# Patient Record
Sex: Female | Born: 1970 | Race: Black or African American | Hispanic: No | Smoking: Never smoker
Health system: Southern US, Community
[De-identification: ages and names within clinical notes are randomized; demographics above are authoritative.]

## PROBLEM LIST (undated history)

## (undated) DIAGNOSIS — Z8669 Personal history of other diseases of the nervous system and sense organs: Secondary | ICD-10-CM

## (undated) DIAGNOSIS — M898X1 Other specified disorders of bone, shoulder: Secondary | ICD-10-CM

## (undated) DIAGNOSIS — G8929 Other chronic pain: Secondary | ICD-10-CM

## (undated) DIAGNOSIS — J302 Other seasonal allergic rhinitis: Secondary | ICD-10-CM

## (undated) HISTORY — DX: Other chronic pain: G89.29

## (undated) HISTORY — DX: Personal history of other diseases of the nervous system and sense organs: Z86.69

## (undated) HISTORY — DX: Other seasonal allergic rhinitis: J30.2

## (undated) HISTORY — DX: Other specified disorders of bone, shoulder: M89.8X1

---

## 2010-04-06 HISTORY — PX: PARTIAL HYSTERECTOMY: SHX80

## 2012-03-04 ENCOUNTER — Ambulatory Visit: Payer: Self-pay | Admitting: Family Medicine

## 2013-01-14 ENCOUNTER — Emergency Department: Payer: Self-pay | Admitting: Emergency Medicine

## 2013-01-14 LAB — CBC
HGB: 12.4 g/dL (ref 12.0–16.0)
MCHC: 34.2 g/dL (ref 32.0–36.0)
MCV: 83 fL (ref 80–100)
Platelet: 226 10*3/uL (ref 150–440)
RBC: 4.37 10*6/uL (ref 3.80–5.20)
RDW: 14.6 % — ABNORMAL HIGH (ref 11.5–14.5)
WBC: 7.5 10*3/uL (ref 3.6–11.0)

## 2013-01-14 LAB — BASIC METABOLIC PANEL
Anion Gap: 8 (ref 7–16)
BUN: 18 mg/dL (ref 7–18)
Calcium, Total: 8.7 mg/dL (ref 8.5–10.1)
Chloride: 104 mmol/L (ref 98–107)
Co2: 27 mmol/L (ref 21–32)
Glucose: 112 mg/dL — ABNORMAL HIGH (ref 65–99)
Potassium: 2.7 mmol/L — ABNORMAL LOW (ref 3.5–5.1)
Sodium: 139 mmol/L (ref 136–145)

## 2013-01-14 LAB — MAGNESIUM: Magnesium: 1.6 mg/dL — ABNORMAL LOW

## 2013-01-14 LAB — TROPONIN I: Troponin-I: <0.02 ng/mL

## 2013-07-24 IMAGING — US ABDOMEN ULTRASOUND
1 series · 14 of 25 positions shown · non-contrast
Comparison: none

REASON FOR EXAM: complete  Left sided abd pain w fever
COMMENTS:

[Series 1: abdomen ultrasound · 0.26mm/px · 14 of 118 slices shown]
[im 1/118]
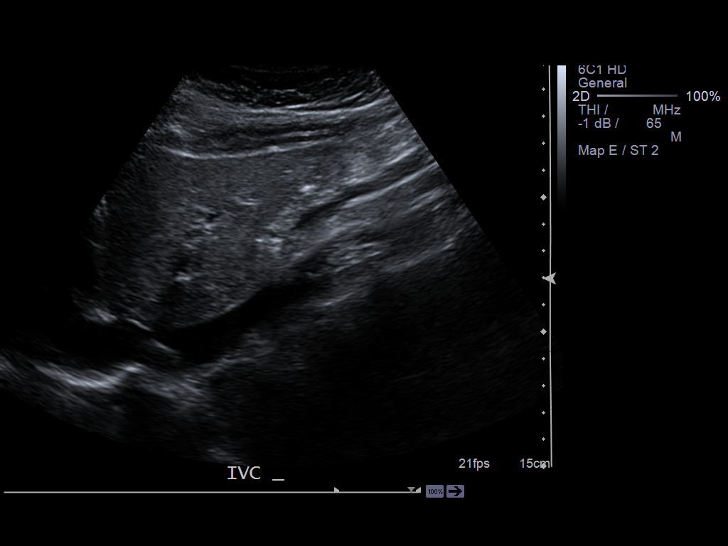
[im 10/118]
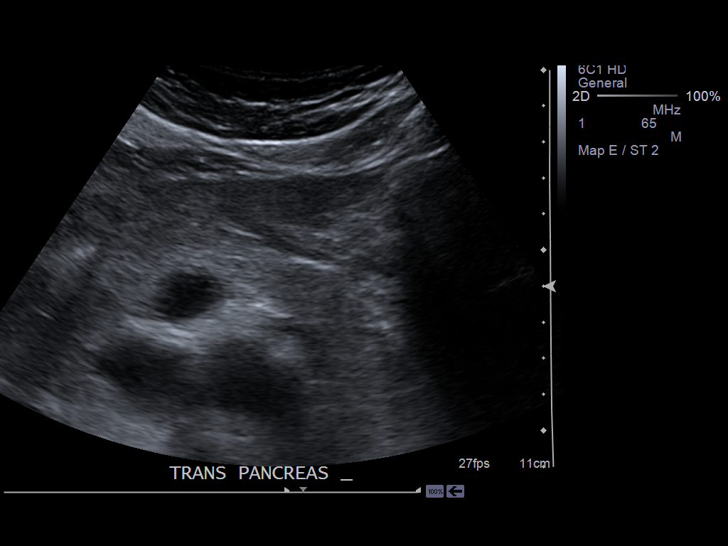
[im 20/118]
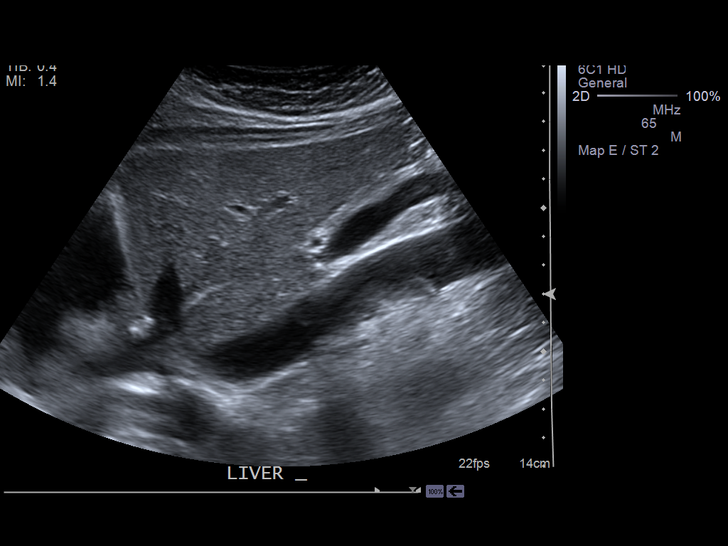
[im 30/118]
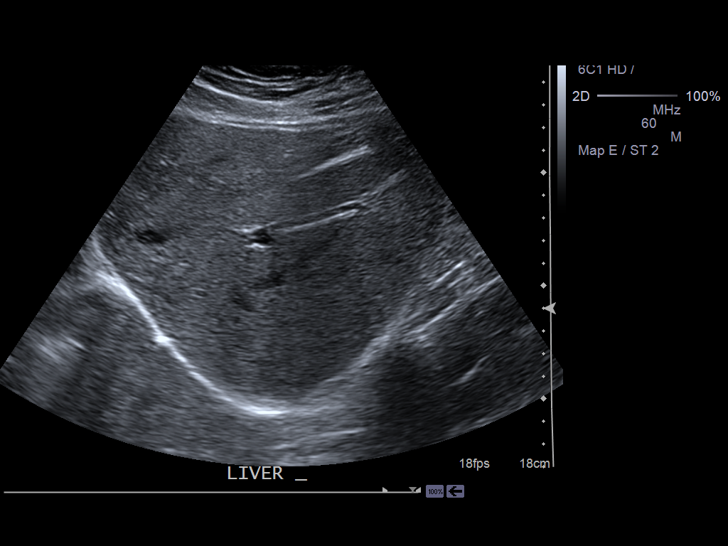
[im 40/118]
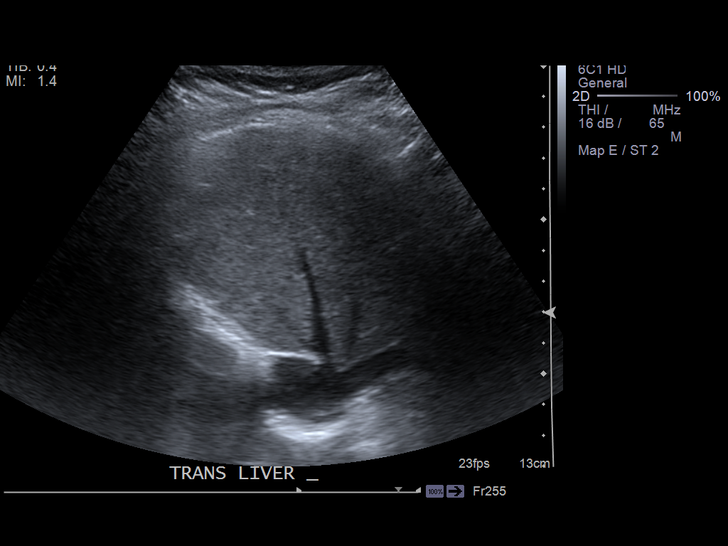
[im 44/118]
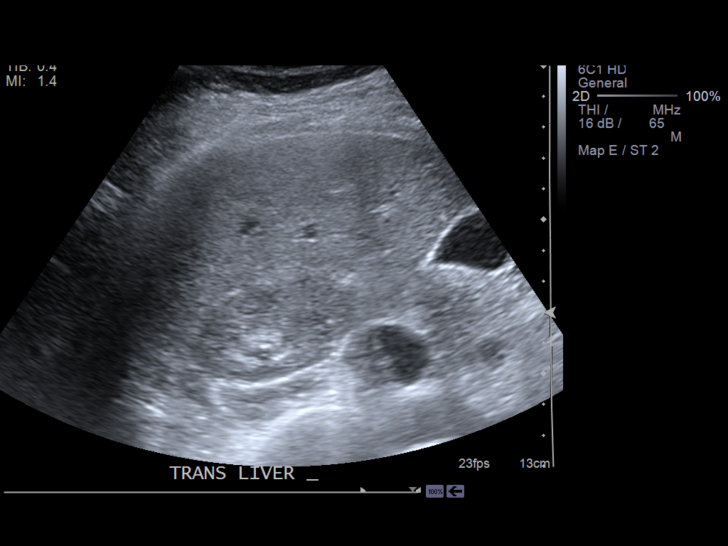
[im 54/118]
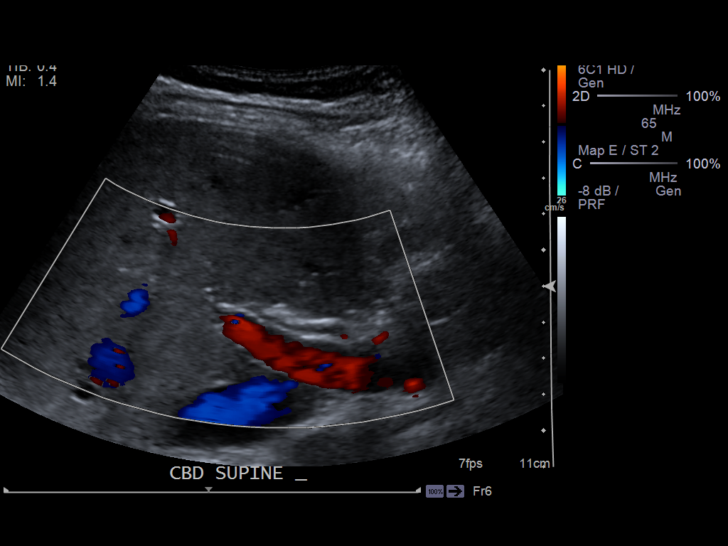
[im 64/118]
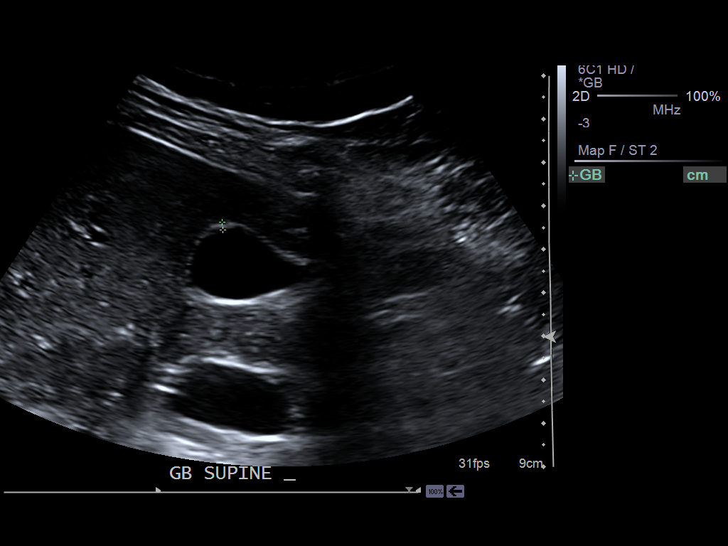
[im 74/118]
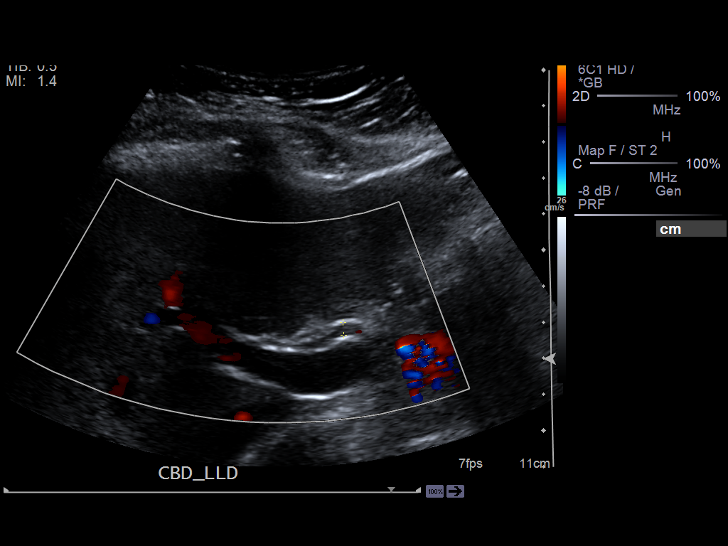
[im 79/118]
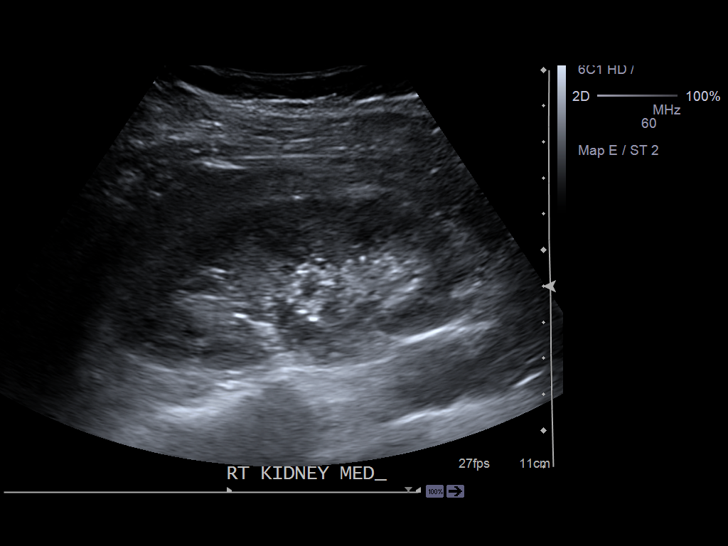
[im 88/118]
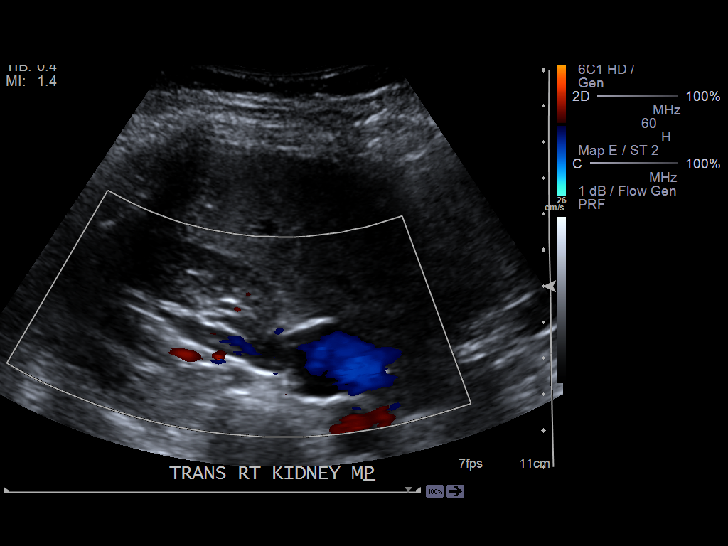
[im 98/118]
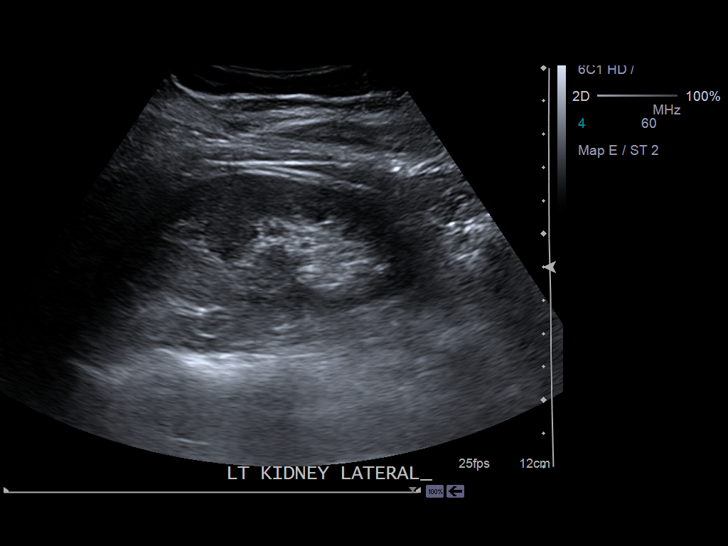
[im 108/118]
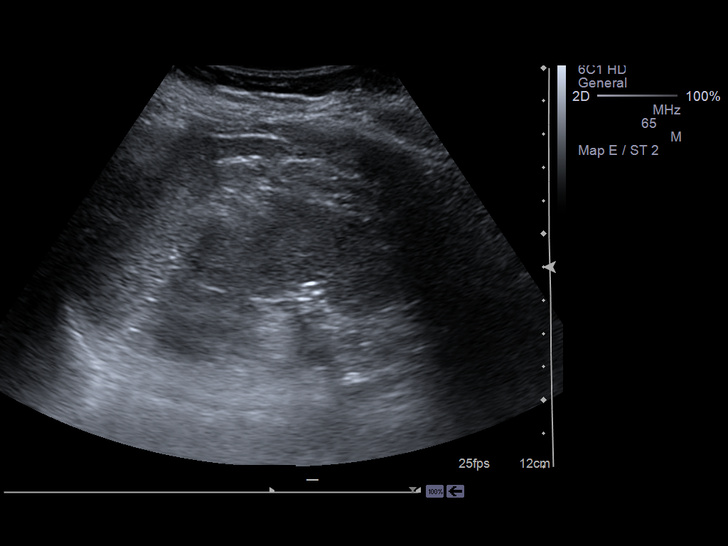
[im 118/118]
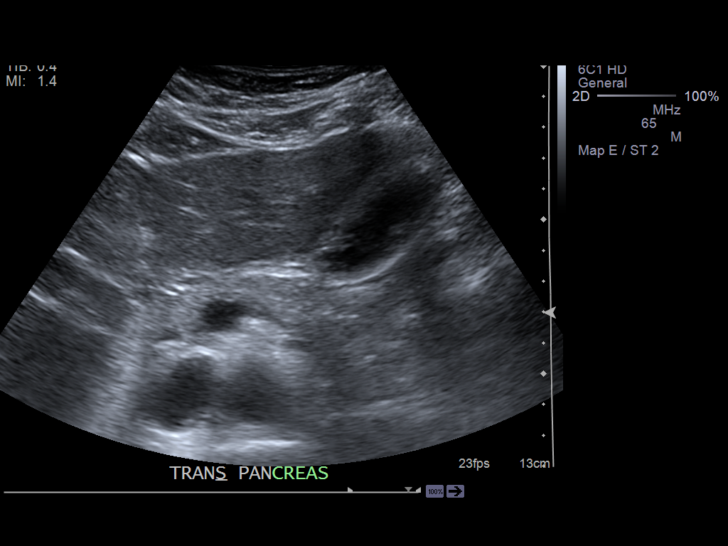

[14 of 25 positions shown; findings below may reference images not displayed]

PROCEDURE:     US  - US ABDOMEN GENERAL SURVEY  - March 04, 2012 [DATE]

RESULT:

Abdominal Ultrasound is performed in the standard fashion. The proximal
inferior vena cava is patent. The visualized aorta appears to taper distally
and shows atherosclerotic calcification. There is no cholelithiasis. The
visualized pancreas appears normal. The liver measures 14.75 cm and is
normal in echotexture. There is no ascites. The common bile duct diameter is
up to 3.6 mm. The gallbladder wall thickness is 1.8 mm. There is a negative
sonographic Murphy's sign without evidence of cholelithiasis or
pericholecystic fluid. The right kidney measures 10.09 x 4.34 x 3.66 cm. The
spleen measures up to 8.17 cm. The left kidney is 9.89 x 4.77 x 5.44 cm.
Both kidneys show no solid or cystic mass. No stones are evident.
IMPRESSION: No evidence of cholelithiasis. Normal appearing Abdominal
Sonogram.

[REDACTED]

## 2014-06-05 IMAGING — CR DG CHEST 2V
1 series · 2 of 2 positions shown · non-contrast
Comparison: none

REASON FOR EXAM: Chest Pain
COMMENTS:

PROCEDURE:     DXR - DXR CHEST PA (OR AP) AND LATERAL  - January 14, 2013  [DATE]
RESULT:     Comparison: None

[Series 1: w chest pa · 0.14mm/px · 2 of 2 slices shown]
[im 1/2]
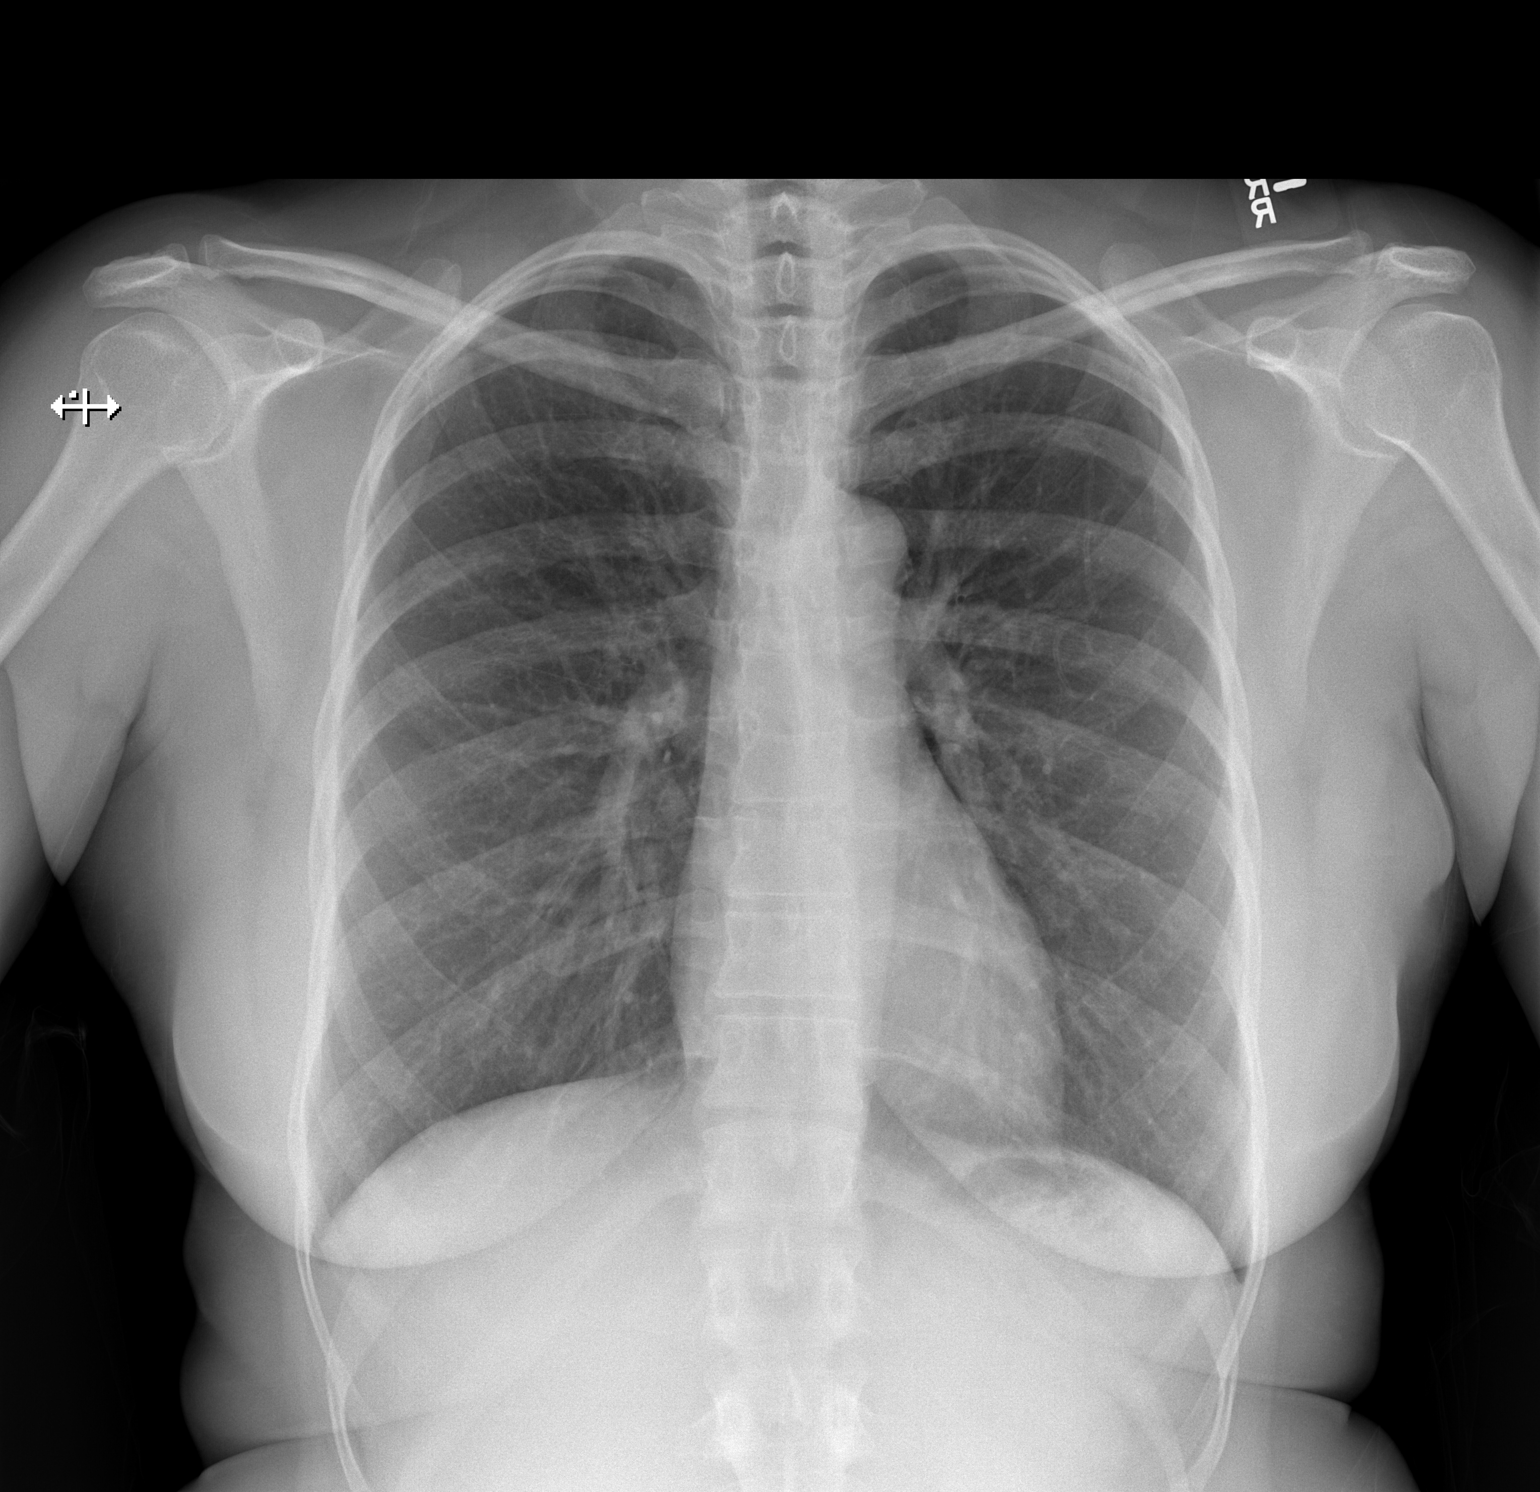
[im 2/2]
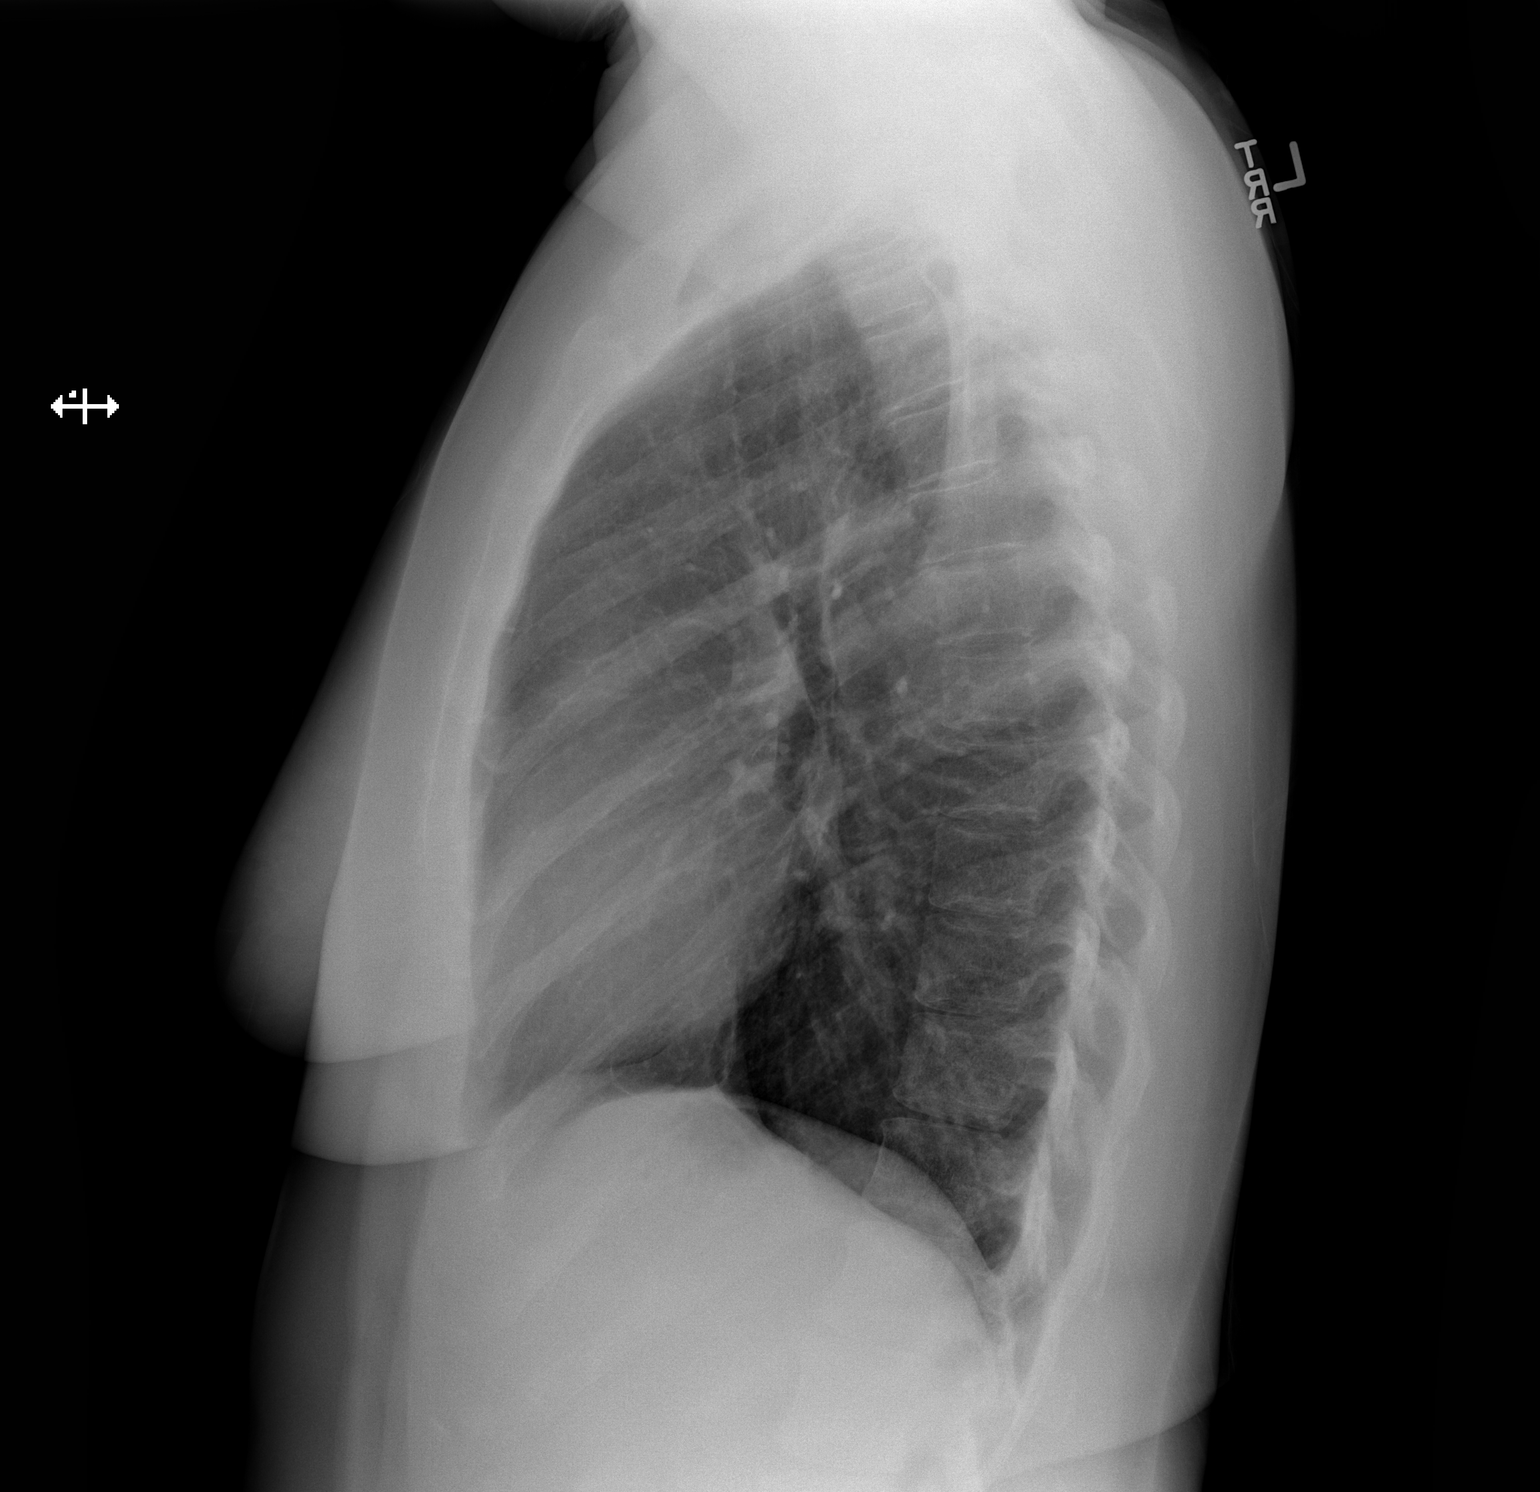

[2 of 2 positions shown; findings below may reference images not displayed]

FINDINGS: PA and lateral chest radiographs are provided.  There is no focal
parenchymal opacity, pleural effusion, or pneumothorax. The heart and
mediastinum are unremarkable.  The osseous structures are unremarkable.
IMPRESSION: No acute disease of the che[REDACTED]

## 2014-12-24 ENCOUNTER — Ambulatory Visit (INDEPENDENT_AMBULATORY_CARE_PROVIDER_SITE_OTHER): Payer: BC Managed Care – PPO | Admitting: Family Medicine

## 2014-12-24 ENCOUNTER — Encounter (INDEPENDENT_AMBULATORY_CARE_PROVIDER_SITE_OTHER): Payer: Self-pay

## 2014-12-24 ENCOUNTER — Encounter: Payer: Self-pay | Admitting: Family Medicine

## 2014-12-24 VITALS — BP 136/80 | HR 60 | Temp 98.3°F | Ht 66.25 in | Wt 142.2 lb

## 2014-12-24 DIAGNOSIS — R002 Palpitations: Secondary | ICD-10-CM

## 2014-12-24 DIAGNOSIS — R202 Paresthesia of skin: Secondary | ICD-10-CM

## 2014-12-24 DIAGNOSIS — M898X1 Other specified disorders of bone, shoulder: Secondary | ICD-10-CM | POA: Diagnosis not present

## 2014-12-24 DIAGNOSIS — G8929 Other chronic pain: Secondary | ICD-10-CM | POA: Diagnosis not present

## 2014-12-24 DIAGNOSIS — R9431 Abnormal electrocardiogram [ECG] [EKG]: Secondary | ICD-10-CM

## 2014-12-24 HISTORY — DX: Other chronic pain: G89.29

## 2014-12-24 LAB — COMPREHENSIVE METABOLIC PANEL
ALK PHOS: 48 U/L (ref 39–117)
ALT: 11 U/L (ref 0–35)
AST: 16 U/L (ref 0–37)
Albumin: 4.4 g/dL (ref 3.5–5.2)
BUN: 17 mg/dL (ref 6–23)
CO2: 28 mEq/L (ref 19–32)
Calcium: 9.1 mg/dL (ref 8.4–10.5)
Chloride: 104 mEq/L (ref 96–112)
Creatinine, Ser: 0.81 mg/dL (ref 0.40–1.20)
GFR: 97.93 mL/min (ref >60.00)
Glucose, Bld: 86 mg/dL (ref 70–99)
Potassium: 3.8 mEq/L (ref 3.5–5.1)
SODIUM: 139 meq/L (ref 135–145)
TOTAL PROTEIN: 7.4 g/dL (ref 6.0–8.3)
Total Bilirubin: 0.5 mg/dL (ref 0.2–1.2)

## 2014-12-24 LAB — CBC WITH DIFFERENTIAL/PLATELET
BASOS ABS: 0.1 10*3/uL (ref 0.0–0.1)
Basophils Relative: 1 % (ref 0.0–3.0)
Eosinophils Absolute: 0.1 10*3/uL (ref 0.0–0.7)
Eosinophils Relative: 1.4 % (ref 0.0–5.0)
HCT: 41.1 % (ref 36.0–46.0)
Hemoglobin: 13.3 g/dL (ref 12.0–15.0)
LYMPHS ABS: 1.8 10*3/uL (ref 0.7–4.0)
Lymphocytes Relative: 32.4 % (ref 12.0–46.0)
MCHC: 32.4 g/dL (ref 30.0–36.0)
MCV: 86 fl (ref 78.0–100.0)
MONO ABS: 0.4 10*3/uL (ref 0.1–1.0)
MONOS PCT: 7.3 % (ref 3.0–12.0)
NEUTROS PCT: 57.9 % (ref 43.0–77.0)
Neutro Abs: 3.3 10*3/uL (ref 1.4–7.7)
Platelets: 225 10*3/uL (ref 150.0–400.0)
RBC: 4.78 Mil/uL (ref 3.87–5.11)
RDW: 14.9 % (ref 11.5–15.5)
WBC: 5.7 10*3/uL (ref 4.0–10.5)

## 2014-12-24 LAB — MAGNESIUM: MAGNESIUM: 2.2 mg/dL (ref 1.5–2.5)

## 2014-12-24 LAB — VITAMIN B12: VITAMIN B 12: 410 pg/mL (ref 211–911)

## 2014-12-24 LAB — T4, FREE: FREE T4: 0.82 ng/dL (ref 0.60–1.60)

## 2014-12-24 LAB — TSH: TSH: 1.5 u[IU]/mL (ref 0.35–4.50)

## 2014-12-24 NOTE — Progress Notes (Signed)
Pre visit review using our clinic review tool, if applicable. No additional management support is needed unless otherwise documented below in the visit note. 

## 2014-12-24 NOTE — Patient Instructions (Addendum)
Consider nasal steroid spray for allergies. labwork today and EKG. We will request records from prior PCP.  Return in 2-3 months for follow up and physical. Good to see you today, call us with questions.

## 2014-12-24 NOTE — Assessment & Plan Note (Signed)
She does have some right sided scapular dysfunction on exam today. No mass appreciated today but she did have tenderness to exam of cervical neck and right scapula. Consider scapular dyskinesia directed PT. Doubt compression related paresthesias however.
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
## 2014-12-24 NOTE — Assessment & Plan Note (Signed)
Check B12, TSH today.  ?cervical radiculopathy - pt states had cervical MRI done - I have requested records today - she may bring report if she has at home.

## 2014-12-24 NOTE — Assessment & Plan Note (Addendum)
Sound overall benign - ?PACs. EKG today - NSR rate 70 without arrhythmia Pt states normal holter monitor 2014. Await records. Will need to further eval thyroid function and blood count.

## 2014-12-25 LAB — T3: T3 TOTAL: 108 ng/dL (ref 80.0–204.0)

## 2014-12-26 DIAGNOSIS — R9431 Abnormal electrocardiogram [ECG] [EKG]: Secondary | ICD-10-CM | POA: Insufficient documentation

## 2014-12-26 NOTE — Assessment & Plan Note (Addendum)
EKG - NSR rate 70, normal axis, intervals, nonspecific QRS and T changes anteriorly. Unchanged from EKG 01/2013. No significant risk factors for cardiac disease (no HTN, nonsmoker), no premature fmhx Denies any cardiac sxs of chest pain/tightness, dyspnea. Will continue to monitor for now
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
## 2014-12-27 ENCOUNTER — Encounter: Payer: Self-pay | Admitting: *Deleted

## 2015-01-02 ENCOUNTER — Encounter: Payer: Self-pay | Admitting: Family Medicine

## 2015-01-02 ENCOUNTER — Telehealth: Payer: Self-pay | Admitting: Family Medicine

## 2015-01-02 NOTE — Telephone Encounter (Signed)
Pt called wanting to know if she could get a herpes lab test/ She would like to get this done in [place] where she lives Please advise pt if she can have labs.  She will let you know then where to send order

## 2015-01-02 NOTE — Telephone Encounter (Signed)
Ok to do. Any exposure concerns?

## 2015-01-03 NOTE — Telephone Encounter (Signed)
Lab order in Kim's box.
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
## 2015-01-03 NOTE — Telephone Encounter (Signed)
Rx mailed to patient.

## 2015-02-25 ENCOUNTER — Encounter: Payer: BC Managed Care – PPO | Admitting: Family Medicine

## 2015-03-26 ENCOUNTER — Encounter: Payer: BC Managed Care – PPO | Admitting: Family Medicine

## 2015-05-09 ENCOUNTER — Encounter: Payer: BC Managed Care – PPO | Admitting: Family Medicine

## 2016-07-14 ENCOUNTER — Emergency Department
Admission: EM | Admit: 2016-07-14 | Discharge: 2016-07-14 | Disposition: A | Discharge status: Home / Self Care | Payer: BC Managed Care – PPO | Attending: Emergency Medicine | Admitting: Emergency Medicine

## 2016-07-14 ENCOUNTER — Encounter: Payer: Self-pay | Admitting: Emergency Medicine

## 2016-07-14 DIAGNOSIS — T148XXA Other injury of unspecified body region, initial encounter: Secondary | ICD-10-CM

## 2016-07-14 DIAGNOSIS — Y999 Unspecified external cause status: Secondary | ICD-10-CM | POA: Diagnosis not present

## 2016-07-14 DIAGNOSIS — S4991XA Unspecified injury of right shoulder and upper arm, initial encounter: Secondary | ICD-10-CM | POA: Diagnosis present

## 2016-07-14 DIAGNOSIS — X500XXA Overexertion from strenuous movement or load, initial encounter: Secondary | ICD-10-CM | POA: Diagnosis not present

## 2016-07-14 DIAGNOSIS — S46811A Strain of other muscles, fascia and tendons at shoulder and upper arm level, right arm, initial encounter: Secondary | ICD-10-CM | POA: Insufficient documentation

## 2016-07-14 DIAGNOSIS — Y929 Unspecified place or not applicable: Secondary | ICD-10-CM | POA: Insufficient documentation

## 2016-07-14 DIAGNOSIS — Y939 Activity, unspecified: Secondary | ICD-10-CM | POA: Insufficient documentation

## 2016-07-14 MED ORDER — METAXALONE 800 MG PO TABS: 800.0000 mg | ORAL_TABLET | Freq: Three times a day (TID) | ORAL | 0 refills | Status: AC

## 2016-07-14 MED ORDER — METAXALONE 800 MG PO TABS: 400.0000 mg | ORAL_TABLET | Freq: Three times a day (TID) | ORAL | 0 refills | Status: DC | PRN

## 2016-07-14 MED ORDER — METAXALONE 800 MG PO TABS
800.0000 mg | ORAL_TABLET | Freq: Once | ORAL | Status: DC
  Filled 2016-07-14: qty 1

## 2016-07-14 MED ORDER — METAXALONE 400 MG HALF TABLET
400.0000 mg | ORAL_TABLET | Freq: Once | ORAL | Status: DC
  Filled 2016-07-14: qty 1

## 2016-07-14 NOTE — ED Notes (Signed)
Called pharmacy to have medication sent up to ED. No available in our pyxis.

## 2016-07-14 NOTE — ED Triage Notes (Addendum)
Patient ambulatory to triage with steady gait, without difficulty or distress noted; pt reports pain to right side shoulder/back since Thursday after increasing weight at gym; using ice/heat and aleve without relief; pt reports that area feels swollen
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
## 2018-01-28 ENCOUNTER — Ambulatory Visit: Payer: BC Managed Care – PPO | Admitting: Family Medicine

## 2018-02-11 ENCOUNTER — Ambulatory Visit: Payer: BC Managed Care – PPO | Admitting: Family Medicine

## 2019-03-14 ENCOUNTER — Encounter: Payer: Self-pay | Admitting: Emergency Medicine

## 2019-03-14 ENCOUNTER — Emergency Department
Admission: EM | Admit: 2019-03-14 | Discharge: 2019-03-14 | Disposition: A | Discharge status: Home / Self Care | Payer: BC Managed Care – PPO | Attending: Emergency Medicine | Admitting: Emergency Medicine

## 2019-03-14 ENCOUNTER — Other Ambulatory Visit: Payer: Self-pay

## 2019-03-14 DIAGNOSIS — R42 Dizziness and giddiness: Secondary | ICD-10-CM

## 2019-03-14 DIAGNOSIS — Z79899 Other long term (current) drug therapy: Secondary | ICD-10-CM | POA: Insufficient documentation

## 2019-03-14 LAB — COMPREHENSIVE METABOLIC PANEL
ALT: 17 U/L (ref 0–44)
AST: 23 U/L (ref 15–41)
Albumin: 4.2 g/dL (ref 3.5–5.0)
Alkaline Phosphatase: 49 U/L (ref 38–126)
Anion gap: 15 (ref 5–15)
BUN: 16 mg/dL (ref 6–20)
CO2: 20 mmol/L — ABNORMAL LOW (ref 22–32)
Calcium: 9 mg/dL (ref 8.9–10.3)
Chloride: 103 mmol/L (ref 98–111)
Creatinine, Ser: 0.87 mg/dL (ref 0.44–1.00)
GFR calc Af Amer: >60 mL/min (ref >60)
GFR calc non Af Amer: >60 mL/min (ref >60)
Glucose, Bld: 142 mg/dL — ABNORMAL HIGH (ref 70–99)
Potassium: 2.9 mmol/L — ABNORMAL LOW (ref 3.5–5.1)
Sodium: 138 mmol/L (ref 135–145)
Total Bilirubin: 0.8 mg/dL (ref 0.3–1.2)
Total Protein: 7.2 g/dL (ref 6.5–8.1)

## 2019-03-14 LAB — CBC WITH DIFFERENTIAL/PLATELET
Abs Immature Granulocytes: 0.01 10*3/uL (ref 0.00–0.07)
Basophils Absolute: 0.1 10*3/uL (ref 0.0–0.1)
Basophils Relative: 1 %
Eosinophils Absolute: 0.1 10*3/uL (ref 0.0–0.5)
Eosinophils Relative: 1 %
HCT: 38.4 % (ref 36.0–46.0)
Hemoglobin: 13.1 g/dL (ref 12.0–15.0)
Immature Granulocytes: 0 %
Lymphocytes Relative: 34 %
Lymphs Abs: 2 10*3/uL (ref 0.7–4.0)
MCH: 27.6 pg (ref 26.0–34.0)
MCHC: 34.1 g/dL (ref 30.0–36.0)
MCV: 80.8 fL (ref 80.0–100.0)
Monocytes Absolute: 0.3 10*3/uL (ref 0.1–1.0)
Monocytes Relative: 6 %
Neutro Abs: 3.4 10*3/uL (ref 1.7–7.7)
Neutrophils Relative %: 58 %
Platelets: 283 10*3/uL (ref 150–400)
RBC: 4.75 MIL/uL (ref 3.87–5.11)
RDW: 14.7 % (ref 11.5–15.5)
WBC: 5.8 10*3/uL (ref 4.0–10.5)
nRBC: 0 % (ref 0.0–0.2)

## 2019-03-14 LAB — TROPONIN I (HIGH SENSITIVITY): Troponin I (High Sensitivity): 2 ng/L (ref <18)

## 2019-03-14 MED ORDER — POTASSIUM CHLORIDE CRYS ER 20 MEQ PO TBCR
40.0000 meq | EXTENDED_RELEASE_TABLET | Freq: Once | ORAL | Status: AC
Start: 2019-03-14 — End: 2019-03-14
  Administered 2019-03-14: 40 meq via ORAL
  Filled 2019-03-14: qty 2

## 2019-03-14 MED ORDER — MECLIZINE HCL 25 MG PO TABS: 25.0000 mg | ORAL_TABLET | Freq: Three times a day (TID) | ORAL | 0 refills | Status: AC | PRN

## 2019-03-14 MED ORDER — SODIUM CHLORIDE 0.9 % IV SOLN
1000.0000 mL | Freq: Once | INTRAVENOUS | Status: AC
  Administered 2019-03-14: 1000 mL via INTRAVENOUS

## 2019-03-14 MED ORDER — ONDANSETRON HCL 4 MG/2ML IJ SOLN
4.0000 mg | Freq: Once | INTRAMUSCULAR | Status: DC
  Filled 2019-03-14: qty 2

## 2019-03-14 MED ORDER — MECLIZINE HCL 25 MG PO TABS
25.0000 mg | ORAL_TABLET | Freq: Once | ORAL | Status: AC
  Administered 2019-03-14: 25 mg via ORAL
  Filled 2019-03-14: qty 1

## 2019-03-14 NOTE — ED Triage Notes (Signed)
Pt to triage via w/c with no distress noted, mask in place; pt brought in by EMS from home for c/o dizziness and nausea

## 2019-03-14 NOTE — ED Notes (Signed)
Pt stated she was given zofran with EMS and refused a second dose. Pt states "I don't take pills." this Rn crushed meds for pt and pt was able to swallow powder.
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
## 2019-06-08 ENCOUNTER — Ambulatory Visit: Payer: BC Managed Care – PPO | Admitting: Family Medicine

## 2019-12-26 ENCOUNTER — Other Ambulatory Visit: Payer: Self-pay | Admitting: Internal Medicine

## 2019-12-26 DIAGNOSIS — Z1231 Encounter for screening mammogram for malignant neoplasm of breast: Secondary | ICD-10-CM

## 2021-03-19 ENCOUNTER — Encounter: Payer: Self-pay | Admitting: *Deleted

## 2021-03-19 ENCOUNTER — Emergency Department: Payer: BC Managed Care – PPO

## 2021-03-19 ENCOUNTER — Emergency Department
Admission: EM | Admit: 2021-03-19 | Discharge: 2021-03-19 | Disposition: A | Discharge status: Home / Self Care | Payer: BC Managed Care – PPO | Attending: Emergency Medicine | Admitting: Emergency Medicine

## 2021-03-19 ENCOUNTER — Other Ambulatory Visit: Payer: Self-pay

## 2021-03-19 DIAGNOSIS — R7989 Other specified abnormal findings of blood chemistry: Secondary | ICD-10-CM | POA: Insufficient documentation

## 2021-03-19 DIAGNOSIS — R000 Tachycardia, unspecified: Secondary | ICD-10-CM | POA: Insufficient documentation

## 2021-03-19 DIAGNOSIS — R002 Palpitations: Secondary | ICD-10-CM | POA: Diagnosis not present

## 2021-03-19 LAB — CBC
HCT: 38.6 % (ref 36.0–46.0)
Hemoglobin: 12.5 g/dL (ref 12.0–15.0)
MCH: 27.8 pg (ref 26.0–34.0)
MCHC: 32.4 g/dL (ref 30.0–36.0)
MCV: 85.8 fL (ref 80.0–100.0)
Platelets: 268 10*3/uL (ref 150–400)
RBC: 4.5 MIL/uL (ref 3.87–5.11)
RDW: 14.2 % (ref 11.5–15.5)
WBC: 8.1 10*3/uL (ref 4.0–10.5)
nRBC: 0 % (ref 0.0–0.2)

## 2021-03-19 LAB — BASIC METABOLIC PANEL
Anion gap: 8 (ref 5–15)
BUN: 13 mg/dL (ref 6–20)
CO2: 26 mmol/L (ref 22–32)
Calcium: 8.7 mg/dL — ABNORMAL LOW (ref 8.9–10.3)
Chloride: 104 mmol/L (ref 98–111)
Creatinine, Ser: 0.89 mg/dL (ref 0.44–1.00)
GFR, Estimated: >60 mL/min (ref >60)
Glucose, Bld: 115 mg/dL — ABNORMAL HIGH (ref 70–99)
Potassium: 3.2 mmol/L — ABNORMAL LOW (ref 3.5–5.1)
Sodium: 138 mmol/L (ref 135–145)

## 2021-03-19 LAB — T4, FREE: Free T4: 0.88 ng/dL (ref 0.61–1.12)

## 2021-03-19 LAB — MAGNESIUM: Magnesium: 2.2 mg/dL (ref 1.7–2.4)

## 2021-03-19 LAB — TSH: TSH: 7.893 u[IU]/mL — ABNORMAL HIGH (ref 0.350–4.500)

## 2021-03-19 LAB — TROPONIN I (HIGH SENSITIVITY): Troponin I (High Sensitivity): 4 ng/L (ref <18)

## 2021-03-19 NOTE — Discharge Instructions (Addendum)
Your blood work was all essentially normal.  Your TSH which is one of your thyroid function studies was elevated but your thyroid hormone was normal.  You should discuss this with your primary care provider, as depending on your symptoms they may decide to treat you for hypothyroidism.  Please follow-up with cardiology as you should probably wear a monitor to evaluate for any arrhythmias.
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
## 2021-03-19 NOTE — ED Triage Notes (Signed)
Pt reports she was awakened with heart racing tonight  no chest pain or sob.  No n/v/d  no diaphoresis.  Pt alert  speech clear.

## 2021-03-25 ENCOUNTER — Other Ambulatory Visit: Payer: Self-pay | Admitting: Internal Medicine

## 2021-03-25 DIAGNOSIS — R55 Syncope and collapse: Secondary | ICD-10-CM

## 2021-04-16 ENCOUNTER — Ambulatory Visit: Admission: RE | Admit: 2021-04-16 | Payer: BC Managed Care – PPO | Source: Ambulatory Visit

## 2022-08-08 IMAGING — CR DG CHEST 2V
2 series · 2 of 2 positions shown · non-contrast
Comparison: Chest radiograph dated 01/14/2013

CLINICAL DATA: Tachycardia.

EXAM:
CHEST - 2 VIEW

[chest pa]
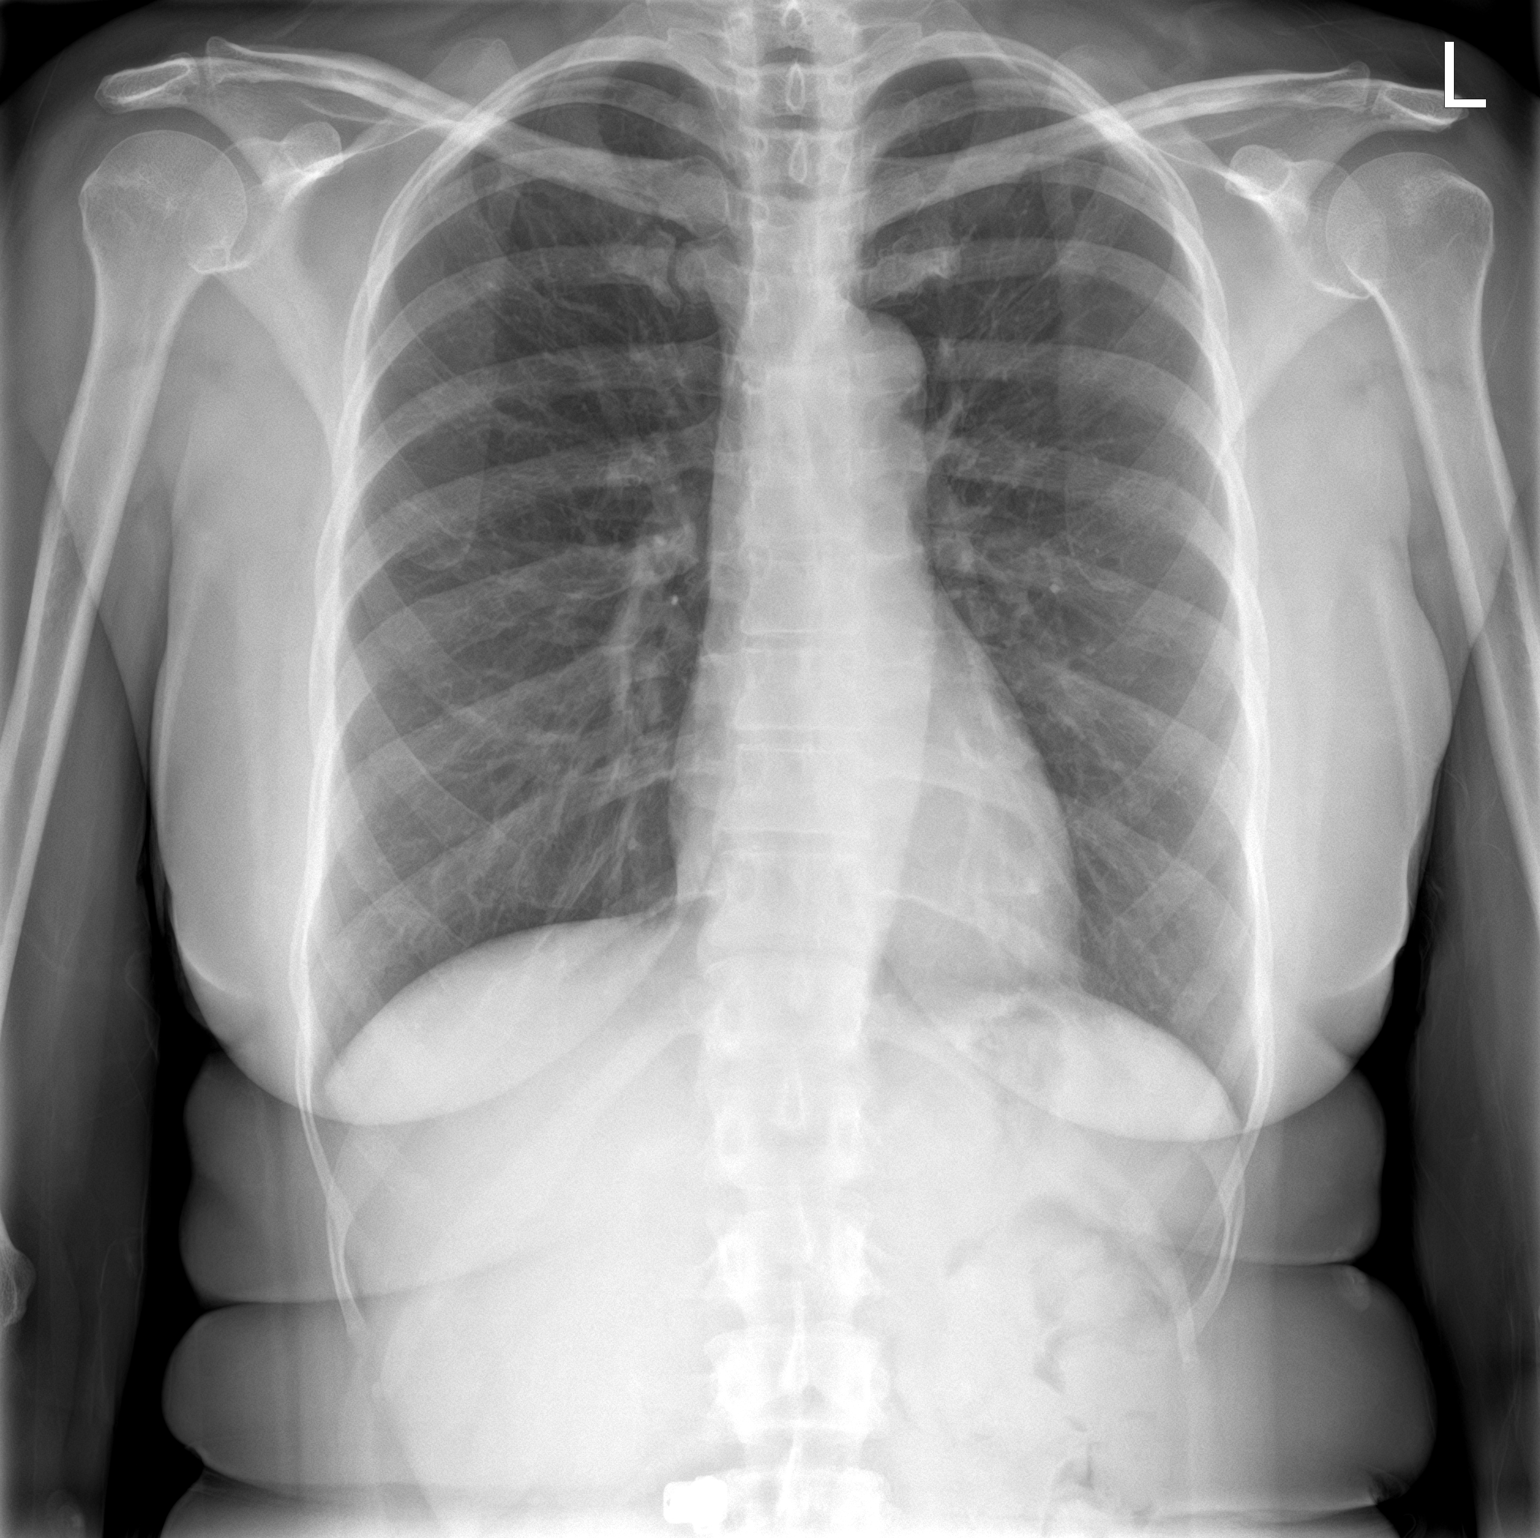

[chest lat]
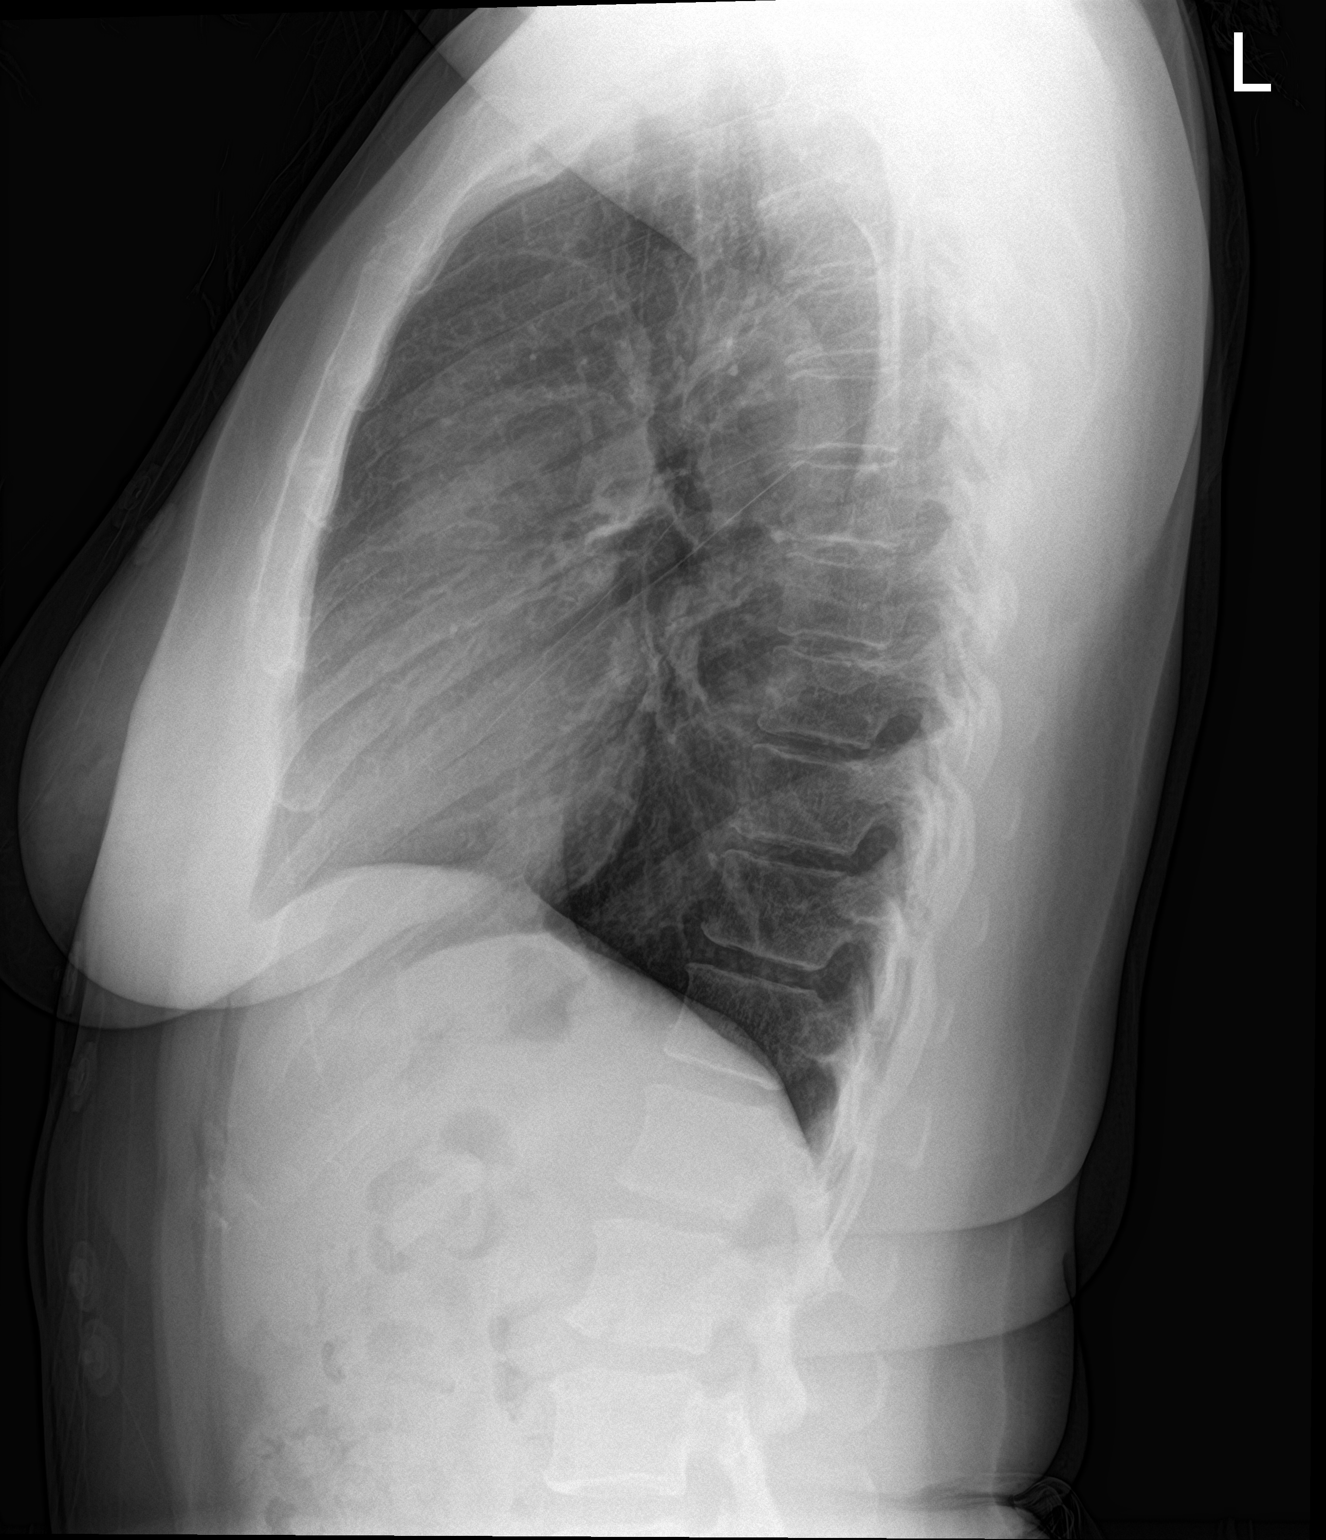

[2 of 2 positions shown; findings below may reference images not displayed]

FINDINGS: The heart size and mediastinal contours are within normal limits.
Both lungs are clear. The visualized skeletal structures are
unremarkable.
IMPRESSION: No active cardiopulmonary disease.
